# Patient Record
Sex: Female | Born: 1965 | Hispanic: Yes | Marital: Married
Health system: Midwestern US, Community
[De-identification: ages and names within clinical notes are randomized; demographics above are authoritative.]

## PROBLEM LIST (undated history)

## (undated) DIAGNOSIS — M5417 Radiculopathy, lumbosacral region: Secondary | ICD-10-CM

## (undated) HISTORY — PX: FRACTURE SURGERY: SHX138

---

## 2016-08-27 ENCOUNTER — Ambulatory Visit: Admit: 2016-08-27 | Payer: PRIVATE HEALTH INSURANCE

## 2016-08-27 ENCOUNTER — Inpatient Hospital Stay: Payer: PRIVATE HEALTH INSURANCE

## 2016-08-27 MED ORDER — IOPAMIDOL 41 % INTRATHECAL
200 mg iodine /mL (41 %) | INTRATHECAL | Status: DC | PRN
Start: 2016-08-27 — End: 2016-08-27
  Administered 2016-08-27: 12:00:00 via INTRA_ARTICULAR

## 2016-08-27 MED ORDER — SODIUM CHLORIDE 0.9 % INJECTION
INTRAMUSCULAR | Status: DC | PRN
Start: 2016-08-27 — End: 2016-08-27
  Administered 2016-08-27: 12:00:00

## 2016-08-27 MED ORDER — DEXAMETHASONE SODIUM PHOSPHATE (PF) 10 MG/ML INJECTION
10 mg/mL | INTRAMUSCULAR | Status: DC
Start: 2016-08-27 — End: 2016-08-27

## 2016-08-27 MED ORDER — PROPOFOL 10 MG/ML IV EMUL
10 mg/mL | INTRAVENOUS | Status: AC
Start: 2016-08-27 — End: 2016-08-27

## 2016-08-27 MED ORDER — IOPAMIDOL 41 % INTRATHECAL
200 mg iodine /mL (41 %) | INTRATHECAL | Status: DC
Start: 2016-08-27 — End: 2016-08-27

## 2016-08-27 MED ORDER — DEXAMETHASONE SODIUM PHOSPHATE (PF) 10 MG/ML INJECTION
10 mg/mL | INTRAMUSCULAR | Status: DC | PRN
Start: 2016-08-27 — End: 2016-08-27
  Administered 2016-08-27: 12:00:00 via EPIDURAL

## 2016-08-27 MED ORDER — LIDOCAINE (PF) 10 MG/ML (1 %) IJ SOLN
10 mg/mL (1 %) | INTRAMUSCULAR | Status: DC | PRN
Start: 2016-08-27 — End: 2016-08-27
  Administered 2016-08-27: 12:00:00 via EPIDURAL

## 2016-08-27 MED ORDER — SODIUM CHLORIDE 0.9 % INJECTION
INTRAMUSCULAR | Status: DC
Start: 2016-08-27 — End: 2016-08-27

## 2016-08-27 MED ORDER — LIDOCAINE (PF) 10 MG/ML (1 %) IJ SOLN
10 mg/mL (1 %) | INTRAMUSCULAR | Status: DC
Start: 2016-08-27 — End: 2016-08-27

## 2016-08-27 MED ORDER — PROPOFOL 10 MG/ML IV EMUL
10 mg/mL | INTRAVENOUS | Status: DC | PRN
Start: 2016-08-27 — End: 2016-08-27
  Administered 2016-08-27 (×4): via INTRAVENOUS

## 2016-08-27 MED ORDER — LACTATED RINGERS IV
INTRAVENOUS | Status: DC | PRN
Start: 2016-08-27 — End: 2016-08-27
  Administered 2016-08-27: 12:00:00 via INTRAVENOUS

## 2016-08-27 MED FILL — SODIUM CHLORIDE 0.9 % INJECTION: INTRAMUSCULAR | Qty: 10

## 2016-08-27 MED FILL — DIPRIVAN 10 MG/ML INTRAVENOUS EMULSION: 10 mg/mL | INTRAVENOUS | Qty: 20

## 2016-08-27 MED FILL — LACTATED RINGERS IV: INTRAVENOUS | Qty: 500

## 2016-08-27 MED FILL — ISOVUE-M 200  41 % INTRATHECAL SOLUTION: 200 mg iodine /mL (41 %) | INTRATHECAL | Qty: 10

## 2016-08-27 MED FILL — DEXAMETHASONE SODIUM PHOSPHATE (PF) 10 MG/ML INJECTION: 10 mg/mL | INTRAMUSCULAR | Qty: 1

## 2016-08-27 MED FILL — LIDOCAINE (PF) 10 MG/ML (1 %) IJ SOLN: 10 mg/mL (1 %) | INTRAMUSCULAR | Qty: 10

## 2016-08-27 NOTE — H&P (Signed)
H & P reviewed as hard copy and no changes were made.

## 2016-08-27 NOTE — Brief Op Note (Signed)
BRIEF OPERATIVE NOTE    Date of Procedure: 08/27/2016   Preoperative Diagnosis: M54.16  RADICULOPATHY  Postoperative Diagnosis: M54.16    Procedure(s):  LUMBAR/SACRAL TRANSFORAMINAL EPIDURAL STEROID INJECTION  Surgeon(s) and Role:     * Filomena Jungling, MD - Primary         Assistant Staff: None      Surgical Staff:  Circ-1: Lenord Carbo, RN  Radiology Technician: Caralee Ates, CNMT, RT, NM, ARRT  No case tracking events are documented in the log.  Anesthesia: MAC   Estimated Blood Loss: None  Specimens: * No specimens in log *   Findings: None  Complications: None  Implants: * No implants in log *

## 2016-08-27 NOTE — Other (Signed)
Discharged with armband removed.  Patient informed of privacy risk if armband lost or stolen. Pt pain 1/10 ice pack applied. Pt tolerating po. Pt instructed on discharge and follow up verbalized understanding.

## 2016-08-27 NOTE — Interval H&P Note (Signed)
H&P Update:  Amanda Woods was seen and examined.  No changes.     Signed By: Milinda AntisGabriel Mattei-Gonzalez, MD     August 27, 2016 7:48 AM

## 2016-08-27 NOTE — Anesthesia Post-Procedure Evaluation (Signed)
Post-Anesthesia Evaluation and Assessment    Patient: Amanda Woods MRN: [id number]  SSN: [id number]    Date of Birth: 1966/03/30  Age: 51 y.o.  Sex: female       Cardiovascular Function/Vital Signs  Visit Vitals   ??? BP 101/54   ??? Pulse 76   ??? Resp 18   ??? Ht 4' 11"  (1.499 m)   ??? Wt 81.6 kg (180 lb)   ??? SpO2 100%   ??? BMI 36.36 kg/m2       Patient is status post MAC anesthesia for Procedure(s):  LUMBAR/SACRAL TRANSFORAMINAL EPIDURAL STEROID INJECTION.    Nausea/Vomiting: None    Postoperative hydration reviewed and adequate.    Pain:  Pain Scale 1: Numeric (0 - 10) (08/27/16 0828)  Pain Intensity 1: 5 (08/27/16 6553)   Managed    Neurological Status:       At baseline    Mental Status and Level of Consciousness: Alert and oriented     Pulmonary Status:   O2 Device: Room air (08/27/16 0827)   Adequate oxygenation and airway patent    Complications related to anesthesia: None    Post-anesthesia assessment completed. No concerns    Signed By: Debroah Baller, CRNA     August 27, 2016

## 2016-08-27 NOTE — Op Note (Signed)
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 The risks, benefits, side effects, complications and alternatives to the procedure were discussed at length prior to its undertaking. Intravenous access was obtained. Conscious sedation was provided by the anesthesiologist. The patient was placed prone on the operating room table. Her back was prepped with Chlorhexadine using sterile technique. The fluoroscopic C-arm was rotated to bring the right  sacral ala pedicle in to view.  Then, 10 cc of 1% Xylocaine was administered for local anesthesia. A 22 gauge Quinke spinal needle was then passed along the trajectory of the fluoroscopic beam to the sacral ala.  The needle was then repositioned 1mm superiorly and anteriorly. The fluoroscopic-C arm rotated in to the anteroposterior plane to assess alignment and then into the lateral plane to assess depth. An epidurogram was obtained to confirm placement, checked both in the anteroposterior and lateral views, utilizing 3cc of Isovue M200 contrast agent. The syringe was changed over. The needle was aspirated. No blood, cerebral spinal fluid or air was observed. The injectate of 2ml cc of preservative-free normal saline, and 10 mg of Dexamethasone was then administered. Vital signs were monitored prior to and during the procedure. They remained stable. The patient was transferred by gurney to the post procedure area in a supine position. Vital signs were monitored for 30 minutes following the procedure and remained stable. The patient tolerated the procedure well without complication. She was discharged to  home. She was instructed to follow up with Dr. Reece AgarG. [facility] in two-three weeks.      Milinda AntisGabriel Mattei-Gonzalez, MD

## 2016-08-27 NOTE — Anesthesia Pre-Procedure Evaluation (Addendum)
Anesthetic History   No history of anesthetic complications            Review of Systems / Medical History  Patient summary reviewed, nursing notes reviewed and pertinent labs reviewed    Pulmonary                   Neuro/Psych     seizures (last episode 11 years ago, not on seizure meds)         Cardiovascular                       GI/Hepatic/Renal         Renal disease: stones       Endo/Other             Other Findings            Physical Exam    Airway  Mallampati: II  TM Distance: 4 - 6 cm  Neck ROM: normal range of motion   Mouth opening: Normal     Cardiovascular    Rhythm: regular  Rate: normal         Dental  No notable dental hx       Pulmonary                 Abdominal         Other Findings            Anesthetic Plan    ASA: 2  Anesthesia type: MAC          Induction: Intravenous  Anesthetic plan and risks discussed with: Patient

## 2016-08-27 NOTE — Op Note (Signed)
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 The risks, benefits, side effects, complications and alternatives to the procedure were discussed at length prior to its undertaking. Intravenous access was obtained. Conscious sedation was provided by the anesthesiologist. The patient was placed prone on the operating room table. Her back was prepped with Chlorhexadine using sterile technique. The fluoroscopic C-arm was rotated to bring the right  sacral ala pedicle in to view.  Then, 10 cc of 1% Xylocaine was administered for local anesthesia. A 22 gauge Quinke spinal needle was then passed along the trajectory of the fluoroscopic beam to the sacral ala.  The needle was then repositioned 1mm superiorly and anteriorly. The fluoroscopic-C arm rotated in to the anteroposterior plane to assess alignment and then into the lateral plane to assess depth. An epidurogram was obtained to confirm placement, checked both in the anteroposterior and lateral views, utilizing 3cc of Isovue M200 contrast agent. The syringe was changed over. The needle was aspirated. No blood, cerebral spinal fluid or air was observed. The injectate of 2ml cc of preservative-free normal saline, and 10 mg of Dexamethasone was then administered. Vital signs were monitored prior to and during the procedure. They remained stable. The patient was transferred by gurney to the post procedure area in a supine position. Vital signs were monitored for 30 minutes following the procedure and remained stable. The patient tolerated the procedure well without complication. She was discharged to home. She was instructed to follow  up with Dr. Reece [facility]G. [facility]onz[facility] in two-three weeks.      Milinda AntisGabriel Mattei-Gonzalez, MD

## 2017-03-10 ENCOUNTER — Ambulatory Visit: Admit: 2017-03-10 | Discharge: 2017-03-10 | Payer: PRIVATE HEALTH INSURANCE | Attending: Interventional Cardiology

## 2017-03-10 DIAGNOSIS — R9431 Abnormal electrocardiogram [ECG] [EKG]: Secondary | ICD-10-CM

## 2017-03-10 NOTE — Progress Notes (Addendum)
This is a 51 year old female with no significant past cardiac history who is now referred for evaluation prior to upcoming orthopedic surgery.    The patient denied prior history of cardiac disease.  She denies symptom of chest discomfort, shortness of breath at rest, dyspnea on exertion, PND or orthopnea.  She also denied palpitation, dizziness.  Routine medical evaluation with twelve-lead EKG showed poor R wave progression from V1 to  V3 suggestive of possible septal wall myocardial infarction.  The patient is scheduled to undergo right ankle surgery.    Past medical history: None.    Medication: None.    Social history: Negative for tobacco use.    Family history: Negative for coronary disease.    Review of Systems:    Constitutional: negative fever, negative chills, negative weight loss  Eyes:   negative diplopia or visual changes, negative eye pain  ENT:   negative coryza, negative sore throat  Respiratory:  negative cough, hemoptysis, dyspnea  Cards:  negative for chest pain, palpitations, lower extremity edema  GI:   negative for nausea, vomiting, diarrhea, and abdominal pain  Genitourinary: negative for frequency, dysuria and hematuria  Integument:  negative for rash and pruritus  Hematologic:  negative for easy bruising and gum/nose bleeding  Musculoskel: negative for myalgias, arthralgias, back pain and muscle weakness  Neurological:  negative for headaches, dizziness, vertigo, memory problems and gait problems  Behavl/Psych: negative for feelings of anxiety, depression     Physical examination: Well-developed well-nourished  moderate female in no acute distress.  Awake and alert, oriented ??3. Heart rate  76 and regular, blood pressure 120/90.  She weighs 198 pounds. HEENT: Extraocular movement intact, pupil equal and reactive to light and accommodation, hearing appeared to be normal, neck, supple no JVD or bruit.  Lungs: Clear to auscultation.  Heart: Normal S1, normal S2, no murmur audible.  Abdomen:  Soft, nontender, no organomegaly felt, no abdominal bruit noted.  Extremities: No peripheral edema, peripheral pulses are 2+ equal bilaterally, full range of motion without discomfort.  Neurological: Grossly intact    Twelve-lead ECG dated September 17 showed normal sinus rhythm, QS in V1 and V2.  Left ventricular hypertrophy by voltage.    Impression: 51 year old female with no significant past cardiac history and no symptoms suggestive of angina or congestive heart failure is now evaluated prior to the right ankle surgery.  Twelve-lead ECG is suggestive of possible prior septal  wall myocardial infarction.  Patient has no prior history suggestive of myocardial injury.  The EKG finding could be due to lead placement or body habitus.  I do not feel that this is a true representation of patient's underlying cardiac status.  Based on the patient is asymptomatic status and the nature of the procedure, there is no acute contraindication for surgery from cardiac point of view.    Recommendation: Proceed with right ankle surgery is planned.  Will obtain an echocardiography as part of baseline cardiac evaluation.  The surgery can be performed prior to the echocardiogram.    Discussed the patient's above normal BMI with the patient.  I have recommended the following interventions: dietary management education, guidance, and counseling  encourage exercise .      The patient was counseled on the dangers of tobacco use.     I attest that current meds have been reviewed and are accurate.

## 2017-05-12 ENCOUNTER — Encounter: Payer: PRIVATE HEALTH INSURANCE | Attending: Interventional Cardiology

## 2017-06-05 ENCOUNTER — Ambulatory Visit: Admit: 2017-06-05 | Discharge: 2017-06-05 | Payer: PRIVATE HEALTH INSURANCE | Attending: Interventional Cardiology

## 2017-06-05 DIAGNOSIS — R9431 Abnormal electrocardiogram [ECG] [EKG]: Secondary | ICD-10-CM

## 2017-06-05 NOTE — Progress Notes (Signed)
The patient is in office for follow-up visit for surgical follow-up and abnormal EKG.    Since the patient's last visit, she underwent a successful right ankle surgery.    In the office, the patient denied chest pain, shortness of breath, dyspnea on exertion, PND or orthopnea.  Echocardiogram showed normal left ventricular systolic function with mild to moderate aortic regurgitation.    Physical examination showed heart rate 100 and regular, blood pressure 130/84.  Patient weight 198 pounds which is unchanged from before.  Patient is awake and oriented ??3, in no acute distress..  Neck: Supple, no JVD or bruit.  Lungs: Clear to auscultation.  Heart: Normal S1, normal S2, no murmur.  Abdomen: Soft, nontender, no abdominal bruit noted.  Extremities: No peripheral edema, full range of motion, steady gait.  Neurological: Grossly intact.    Impression: 1 abnormal ECG, nonspecific changes.  2 mild aortic regurgitation.  The patient is asymptomatic.    Recommendation: Continue clinical observation.    Follow-up in 6 months.      Discussed the patient's above normal BMI with the patient.  I have recommended the following interventions: dietary management education, guidance, and counseling  encourage exercise .      The patient was counseled on the dangers of tobacco use.     I attest that current meds have been reviewed and are accurate.

## 2017-12-08 ENCOUNTER — Ambulatory Visit: Admit: 2017-12-08 | Discharge: 2017-12-08 | Attending: Interventional Cardiology

## 2017-12-08 ENCOUNTER — Ambulatory Visit: Attending: Interventional Cardiology

## 2017-12-08 DIAGNOSIS — I351 Nonrheumatic aortic (valve) insufficiency: Secondary | ICD-10-CM

## 2017-12-08 NOTE — Progress Notes (Signed)
The patient is in office for follow-up visit for history of abnormal EKG.    Since the patient's last visit, she has been feeling well.  She denied symptom of chest discomfort, shortness of breath at rest, dyspnea on exertion, PND or orthopnea.  Patient has remained physically active.    Physical examination showed heart rate 78 and regular, blood pressure 110/70 with repeat 118/70.  Patient weighs 195 pounds which is a decrease of 3 pounds from before..  Patient is awake and oriented ??3, in no acute distress..  Neck: Supple, no JVD or bruit.  Lungs: Clear to auscultation.  Heart: Normal S1, normal S2, no murmur.  Abdomen: Soft, nontender, no abdominal bruit noted.  Extremities: No peripheral edema, full range of motion, steady gait.  Neurological: Grossly intact.    Twelve-lead ECG showed normal sinus rhythm, minor nonspecific ST-T wave changes, left ventricular hypertrophy by voltage.    Impression: 1 history of abnormal EKG, nonspecific changes.  2 mild aortic regurgitation, asymptomatic.    Recommendation: Continue clinical observation.    Follow-up PRN.    Discussed the patient's above normal BMI with the patient.  I have recommended the following interventions: dietary management education, guidance, and counseling  encourage exercise .      The patient was counseled on the dangers of tobacco use.     I attest that current meds have been reviewed and are accurate.

## 2017-12-08 NOTE — Progress Notes (Signed)
The patient is in office for follow-up visit for history of abnormal EKG.    Since the patient's last visit, she has been feeling well.  She denied symptom of chest discomfort, shortness of breath at rest, dyspnea on exertion, PND or orthopnea.  Patient has remained physically active.    Physical examination showed heart rate 78 and regular, blood pressure 110/70 with repeat 118/70.  Patient weighs 195 pounds which is a decrease of 3 pounds from before..  Patient is awake and oriented ??3, in no acute distress..  Neck: Supple, no JVD or bruit.  Lungs: Clear to auscultation.  Heart: Normal S1, normal S2, no murmur.  Abdomen: Soft, nontender, no abdominal bruit noted.  Extremities: No peripheral edema, full range of motion, steady gait.  Neurological: Grossly intact.    Twelve-lead ECG showed normal sinus rhythm, minor nonspecific ST-T wave changes, left ventricular hypertrophy by voltage.    Impression: 1 history of abnormal EKG, nonspecific changes.  2 mild aortic regurgitation, asymptomatic.    Recommendation: Continue clinical observation.    Follow-up PRN.    Discussed the patient's above normal BMI with the patient.  I have recommended the following interventions: dietary management education, guidance, and counseling  encourage exercise .      The patient was counseled on the dangers of tobacco use.     I attest that current meds have been reviewed and are accurate.

## 2019-05-24 ENCOUNTER — Encounter: Attending: Family Medicine

## 2019-07-13 ENCOUNTER — Encounter: Attending: Internal Medicine

## 2019-07-28 ENCOUNTER — Ambulatory Visit: Admit: 2019-07-28 | Discharge: 2019-07-28 | Payer: MEDICAID | Attending: Internal Medicine

## 2019-07-28 ENCOUNTER — Ambulatory Visit: Attending: Internal Medicine

## 2019-07-28 NOTE — Progress Notes (Signed)
Chief Complaint:  54 y.o.female here to establish care       HPI   Pt previously was followed at [facility] she wants someone closer    Has had any significant medical issues  Has gained significant wt (180 lb in 2018->208)  C/o bilateral anterior shin pain with excessive walking       Past Medical History:   Diagnosis Date   ??? Seizures (HCC)     11 yrs. ago. not on any medications for seizures.   ??? Severe obesity (BMI 35.0-39.9) 03/10/2017       Past Surgical History:   Procedure Laterality Date   ??? HX CESAREAN SECTION     ??? HX UROLOGICAL      kidney stones 13 yrs. ago   ??? IR CHOLECYSTOSTOMY PERCUTANEOUS         Family History   Problem Relation Age of Onset   ??? No Known Problems Mother    ??? No Known Problems Father    ??? No Known Problems Sister    ??? No Known Problems Brother      Family Status   Relation Name Status   ??? Mother  (Not Specified)   ??? Father  (Not Specified)   ??? Sister 3 Alive   ??? Brother 6 Alive   ??? Child 3 Alive       No Known Allergies      Patient is tolerating all medications as prescribed with no barriers to adherence noted.    Health Maintenance Topics with due status: Overdue       Topic Date Due    DTaP/Tdap/Td series 02/08/1986    Lipid Screen 02/08/2005    Shingrix Vaccine Age 89> 02/09/2015    Colorectal Cancer Screening Combo 02/09/2015    Breast Cancer Screen Mammogram 03/06/2018    Flu Vaccine 02/16/2019           Social History     Socioeconomic History   ??? Marital status: MARRIED     Spouse name: manuel   ??? Number of children: 3   ??? Years of education: Not on file   ??? Highest education level: Not on file   Occupational History   ??? Not on file   Social Needs   ??? Financial resource strain: Somewhat hard   ??? Food insecurity     Worry: Never true     Inability: Never true   ??? Transportation needs     Medical: No     Non-medical: No   Tobacco Use   ??? Smoking status: Never Smoker   ??? Smokeless tobacco: Never Used   Substance and Sexual Activity   ??? Alcohol use: Yes      Frequency: Monthly or less     Comment: socially, few times a year.   ??? Drug use: No   ??? Sexual activity: Yes     Partners: Male   Lifestyle   ??? Physical activity     Days per week: Not on file     Minutes per session: Not on file   ??? Stress: Only a little   Relationships   ??? Social Wellsite geologist on phone: Not on file     Gets together: Not on file     Attends religious service: Not on file     Active member of club or organization: Not on file     Attends meetings of clubs or organizations: Not on file  Relationship status: Not on file   ??? Intimate partner violence     Fear of current or ex partner: Not on file     Emotionally abused: Not on file     Physically abused: Not on file     Forced sexual activity: Not on file   Other Topics Concern   ??? Not on file   Social History Narrative    ** Merged History Encounter **            REVIEW OF SYSTEMS:  Review of Systems:  Review of systems is negative for chest pain, chest pressure, nausea, vomiting, diarrhea, headache, fever, chills, hemoptysis, Orthopnea, Dyspnea, hematemesis, melena, bright red blood per rectum, dysuria, frequency, hematuria, any recent Gain or loss of weight, that is unexplained, or skin changes or rashes except as stated in HPI        General appearance: Well-developed, well-nourished, pleasant and comfortable  Visit Vitals  BP 120/80   Pulse 100   Temp 97.4 ??F (36.3 ??C) (Temporal)   Resp 16   Ht 4\' 11"  (1.499 m)   Wt 208 lb (94.3 kg)   SpO2 98%   BMI 42.01 kg/m??     HEENT: Normocepalic atraumatic Pupils equally round and reactive to light and accomodation. Pharynx tonsils nl. No exudate. Ears: tm nl.   Neck: Without lymphadenopathy, thyromegaly, jugular venous distention, or carotid bruit  Respiratory: lungs clear to auscultation bilaterally without rales, rhonchi, or wheeze  Cardiovascular: regular rate and rhythm without murmur, rub or gallup   Abdomen: soft, non-tender,nondistended without hepatosplenomegaly   Extremities:2+ dorsalis pedis bilaterally without clubbing,cyanosis, or edema  Skin: without rash, bruise, jaundice  Neurologic: alert and oriented to person, place, and date, cranial nerves grossly intact              EKG 07/28/2019 No results found for this visit on 07/28/19.      Plan  1. Class 3 severe obesity due to excess calories without serious comorbidity with body mass index (BMI) of 40.0 to 44.9 in adult [facility])  Discuss diet and exercisse  - CBC WITH AUTOMATED DIFF  - METABOLIC PANEL, COMPREHENSIVE  - LIPID PANEL  - TSH 3RD GENERATION  - VITAMIN D, 25 HYDROXY    2. Myalgia  Check labs   - CBC WITH AUTOMATED DIFF  - METABOLIC PANEL, COMPREHENSIVE  - LIPID PANEL  - TSH 3RD GENERATION  - VITAMIN D, 25 HYDROXY            Medications discussed, including all new medications prescribed at this visit.  Indications and side effects reviewed with the patient/family/caregiver with understanding verbalized  Follow-up and Dispositions     Routing History          Isaiah Serge, MD      I have spent 45 minutes reviewing previous notes, test results and face to face with the patient discussing the diagnosis and importance of compliance with the treatment plan as well as documenting on the day of the visit          Care Plan discussed with:    [] Patient   [] Family    [] Care Manager    [] Nursing   [] Consultant/Specialist :

## 2019-07-28 NOTE — Progress Notes (Signed)
scanned

## 2019-07-28 NOTE — Progress Notes (Signed)
Chief Complaint:  54 y.o.female here to establish care       HPI   Pt previously was followed at [facility] she wants someone closer    Has had any significant medical issues  Has gained significant wt (180 lb in 2018->208)  C/o bilateral anterior shin pain with excessive walking       Past Medical History:   Diagnosis Date   ??? Seizures (HCC)     11 yrs. ago. not on any medications for seizures.   ??? Severe obesity (BMI 35.0-39.9) 03/10/2017       Past Surgical History:   Procedure Laterality Date   ??? HX CESAREAN SECTION     ??? HX UROLOGICAL      kidney stones 13 yrs. ago   ??? IR CHOLECYSTOSTOMY PERCUTANEOUS         Family History   Problem Relation Age of Onset   ??? No Known Problems Mother    ??? No Known Problems Father    ??? No Known Problems Sister    ??? No Known Problems Brother      Family Status   Relation Name Status   ??? Mother  (Not Specified)   ??? Father  (Not Specified)   ??? Sister 3 Alive   ??? Brother 6 Alive   ??? Child 3 Alive       No Known Allergies      Patient is tolerating all medications as prescribed with no barriers to adherence noted.    Health Maintenance Topics with due status: Overdue       Topic Date Due    DTaP/Tdap/Td series 02/08/1986    Lipid Screen 02/08/2005    Shingrix Vaccine Age 86> 02/09/2015    Colorectal Cancer Screening Combo 02/09/2015    Breast Cancer Screen Mammogram 03/06/2018    Flu Vaccine 02/16/2019           Social History     Socioeconomic History   ??? Marital status: MARRIED     Spouse name: manuel   ??? Number of children: 3   ??? Years of education: Not on file   ??? Highest education level: Not on file   Occupational History   ??? Not on file   Social Needs   ??? Financial resource strain: Somewhat hard   ??? Food insecurity     Worry: Never true     Inability: Never true   ??? Transportation needs     Medical: No     Non-medical: No   Tobacco Use   ??? Smoking status: Never Smoker   ??? Smokeless tobacco: Never Used   Substance and Sexual Activity   ??? Alcohol use: Yes      Frequency: Monthly or less     Comment: socially, few times a year.   ??? Drug use: No   ??? Sexual activity: Yes     Partners: Male   Lifestyle   ??? Physical activity     Days per week: Not on file     Minutes per session: Not on file   ??? Stress: Only a little   Relationships   ??? Social Wellsite geologist on phone: Not on file     Gets together: Not on file     Attends religious service: Not on file     Active member of club or organization: Not on file     Attends meetings of clubs or organizations: Not on file  Relationship status: Not on file   ??? Intimate partner violence     Fear of current or ex partner: Not on file     Emotionally abused: Not on file     Physically abused: Not on file     Forced sexual activity: Not on file   Other Topics Concern   ??? Not on file   Social History Narrative    ** Merged History Encounter **            REVIEW OF SYSTEMS:  Review of Systems:  Review of systems is negative for chest pain, chest pressure, nausea, vomiting, diarrhea, headache, fever, chills, hemoptysis, Orthopnea, Dyspnea, hematemesis, melena, bright red blood per rectum, dysuria, frequency, hematuria, any recent Gain or loss of weight, that is unexplained, or skin changes or rashes except as stated in HPI        General appearance: Well-developed, well-nourished, pleasant and comfortable  Visit Vitals  BP 120/80   Pulse 100   Temp 97.4 ??F (36.3 ??C) (Temporal)   Resp 16   Ht 4\' 11"  (1.499 m)   Wt 208 lb (94.3 kg)   SpO2 98%   BMI 42.01 kg/m??     HEENT: Normocepalic atraumatic Pupils equally round and reactive to light and accomodation. Pharynx tonsils nl. No exudate. Ears: tm nl.   Neck: Without lymphadenopathy, thyromegaly, jugular venous distention, or carotid bruit  Respiratory: lungs clear to auscultation bilaterally without rales, rhonchi, or wheeze  Cardiovascular: regular rate and rhythm without murmur, rub or gallup   Abdomen: soft, non-tender,nondistended without hepatosplenomegaly  Extremities:2+ dorsalis  pedis bilaterally without clubbing,cyanosis, or edema  Skin: without rash, bruise, jaundice  Neurologic: alert and oriented to person, place, and date, cranial nerves grossly intact              EKG 07/28/2019 No results found for this visit on 07/28/19.      Plan  1. Class 3 severe obesity due to excess calories without serious comorbidity with body mass index (BMI) of 40.0 to 44.9 in adult [facility])  Discuss diet and exercisse  - CBC WITH AUTOMATED DIFF  - METABOLIC PANEL, COMPREHENSIVE  - LIPID PANEL  - TSH 3RD GENERATION  - VITAMIN D, 25 HYDROXY    2. Myalgia  Check labs   - CBC WITH AUTOMATED DIFF  - METABOLIC PANEL, COMPREHENSIVE  - LIPID PANEL  - TSH 3RD GENERATION  - VITAMIN D, 25 HYDROXY            Medications discussed, including all new medications prescribed at this visit.  Indications and side effects reviewed with the patient/family/caregiver with understanding verbalized  Follow-up and Dispositions     Routing History          Isaiah Serge, MD      I have spent 45 minutes reviewing previous notes, test results and face to face with the patient discussing the diagnosis and importance of compliance with the treatment plan as well as documenting on the day of the visit          Care Plan discussed with:    [] Patient   [] Family    [] Care Manager    [] Nursing   [] Consultant/Specialist :

## 2019-08-07 NOTE — Progress Notes (Signed)
Labs are fine   Inc vit d intake

## 2019-08-07 NOTE — Progress Notes (Signed)
Lm for pt to call us back

## 2019-08-07 NOTE — Progress Notes (Signed)
Lab are nl except vit d intake  Inc vit d intake

## 2019-08-07 NOTE — Progress Notes (Signed)
Labs are fine   Inc vit d intake

## 2019-08-07 NOTE — Progress Notes (Signed)
Lab are nl except vit d intake  Inc vit d intake

## 2019-08-08 LAB — CBC WITH AUTO DIFFERENTIAL
Basophils %: 0.7 % (ref 0–2)
Basophils Absolute: 42 cells/uL (ref 0–200)
Eosinophils %: 2.5 % (ref 0–8)
Eosinophils Absolute: 150 cells/uL (ref 15–500)
Hematocrit: 42.1 % (ref 35.0–45.0)
Hemoglobin: 13.8 g/dL (ref 11.7–15.5)
Lymphocytes %: 40.2 % (ref 15–49)
Lymphocytes Absolute: 2412 cells/uL (ref 850–3900)
MCH: 28.5 pg (ref 27.0–33.0)
MCHC: 32.8 g/dL (ref 32.0–36.0)
MCV: 86.8 fL (ref 80.0–100.0)
MPV: 10.5 fL (ref 7.5–12.5)
Monocytes %: 6.4 % (ref 0–13)
Monocytes Absolute: 384 cells/uL (ref 200–950)
Neutrophils %: 50.2 % (ref 38–80)
Neutrophils Absolute: 3012 cells/uL (ref 1500–7800)
Platelets: 291 10*3/uL (ref 140–400)
RBC: 4.85 10*6/uL (ref 3.80–5.10)
RDW: 13.1 % (ref 11.0–15.0)
WBC: 6 10*3/uL (ref 3.8–10.8)

## 2019-08-08 LAB — COMPREHENSIVE METABOLIC PANEL
ALT: 42 U/L — ABNORMAL HIGH (ref 6–29)
AST: 28 U/L (ref 10–35)
Albumin/Globulin Ratio: 1.7 (calc) (ref 1.0–2.5)
Albumin: 4.5 g/dL (ref 3.6–5.1)
Alkaline Phosphatase: 98 U/L (ref 37–153)
BUN: 11 mg/dL (ref 7–25)
CO2: 29 mmol/L (ref 20–32)
Calcium: 9.6 mg/dL (ref 8.6–10.4)
Chloride: 104 mmol/L (ref 98–110)
Creatinine: 0.54 mg/dL (ref 0.50–1.05)
EGFR IF NonAfrican American: 107 mL/min/{1.73_m2} (ref >=60)
GFR African American: 124 mL/min/{1.73_m2} (ref >=60)
Globulin: 2.6 g/dL (calc) (ref 1.9–3.7)
Glucose: 94 mg/dL (ref 65–99)
Potassium: 4.3 mmol/L (ref 3.5–5.3)
Sodium: 141 mmol/L (ref 135–146)
Total Bilirubin: 0.5 mg/dL (ref 0.2–1.2)
Total Protein: 7.1 g/dL (ref 6.1–8.1)

## 2019-08-08 LAB — LIPID PANEL
Chol/HDL Ratio: 3.1 (calc) (ref <5.0)
Cholesterol, Total: 195 mg/dL (ref <200)
Cholesterol, total: 195 mg/dL (ref <200)
Cholesterol/HDL ratio: 3.1 (calc) (ref <5.0)
HDL Cholesterol: 62 mg/dL (ref >=50)
HDL: 62 mg/dL (ref >=50)
LDL Cholesterol: 115 mg/dL (calc) — ABNORMAL HIGH
LDL-CHOLESTEROL: 115 mg/dL (calc) — ABNORMAL HIGH
Non-HDL Cholesterol: 133 mg/dL (calc) — ABNORMAL HIGH (ref <130)
Non-HDL Cholesterol: 133 mg/dL (calc) — ABNORMAL HIGH (ref <130)
Triglyceride: 85 mg/dL (ref <150)
Triglycerides: 85 mg/dL (ref <150)

## 2019-08-08 LAB — VITAMIN D 25 HYDROXY: Vit D, 25-Hydroxy: 24 ng/mL — ABNORMAL LOW (ref 30–100)

## 2019-08-08 LAB — TSH 3RD GENERATION
TSH: 2.39 mIU/L
TSH: 2.39 mIU/L

## 2019-08-08 LAB — METABOLIC PANEL, COMPREHENSIVE
ALB/GLOBRATIO: 1.7 (calc) (ref 1.0–2.5)
ALT (SGPT): 42 U/L — ABNORMAL HIGH (ref 6–29)
AST (SGOT): 28 U/L (ref 10–35)
Albumin: 4.5 g/dL (ref 3.6–5.1)
Alkaline Phosphatase, total: 98 U/L (ref 37–153)
BUN: 11 mg/dL (ref 7–25)
Bilirubin, total: 0.5 mg/dL (ref 0.2–1.2)
CO2: 29 mmol/L (ref 20–32)
Calcium: 9.6 mg/dL (ref 8.6–10.4)
Chloride: 104 mmol/L (ref 98–110)
Creatinine: 0.54 mg/dL (ref 0.50–1.05)
GFR est AA: 124 mL/min/{1.73_m2} (ref >=60)
GFR est non-AA: 107 mL/min/{1.73_m2} (ref >=60)
Globulin: 2.6 g/dL (calc) (ref 1.9–3.7)
Glucose: 94 mg/dL (ref 65–99)
Potassium: 4.3 mmol/L (ref 3.5–5.3)
Protein, total: 7.1 g/dL (ref 6.1–8.1)
Sodium: 141 mmol/L (ref 135–146)

## 2019-08-08 LAB — CBC WITH AUTOMATED DIFF
ABS. BASOPHILS: 42 cells/uL (ref 0–200)
ABS. EOSINOPHILS: 150 cells/uL (ref 15–500)
ABS. LYMPHOCYTES: 2412 cells/uL (ref 850–3900)
ABS. MONOCYTES: 384 cells/uL (ref 200–950)
ABS. NEUTROPHILS: 3012 cells/uL (ref 1500–7800)
BASOPHILS: 0.7 % (ref 0–2)
EOSINOPHILS: 2.5 % (ref 0–8)
HCT: 42.1 % (ref 35.0–45.0)
HGB: 13.8 g/dL (ref 11.7–15.5)
LYMPHOCYTES: 40.2 % (ref 15–49)
MCH: 28.5 pg (ref 27.0–33.0)
MCHC: 32.8 g/dL (ref 32.0–36.0)
MCV: 86.8 fL (ref 80.0–100.0)
MEAN PLATELET VOLUME: 10.5 fL (ref 7.5–12.5)
MONOCYTES: 6.4 % (ref 0–13)
Neutrophils: 50.2 % (ref 38–80)
PLATELET: 291 10*3/uL (ref 140–400)
RBC: 4.85 10*6/uL (ref 3.80–5.10)
RDW: 13.1 % (ref 11.0–15.0)
WBC: 6 10*3/uL (ref 3.8–10.8)

## 2019-08-08 LAB — VITAMIN D, 25 HYDROXY: VITAMIN D, 25-HYDROXY: 24 ng/mL — ABNORMAL LOW (ref 30–100)

## 2019-09-30 NOTE — Telephone Encounter (Signed)
Patient's daughter Nelva Bush [phone])  Advised of results.  How much OTC vitamin D?

## 2019-10-01 NOTE — Telephone Encounter (Signed)
Advised 

## 2020-08-02 ENCOUNTER — Encounter: Attending: Internal Medicine

## 2020-10-17 ENCOUNTER — Emergency Department (HOSPITAL_COMMUNITY): Payer: Self-pay

## 2020-10-17 ENCOUNTER — Emergency Department (HOSPITAL_COMMUNITY)
Admission: EM | Admit: 2020-10-17 | Discharge: 2020-10-18 | Disposition: A | Discharge status: Home / Self Care | Payer: Self-pay | Attending: Emergency Medicine | Admitting: Emergency Medicine

## 2020-10-17 DIAGNOSIS — K209 Esophagitis, unspecified without bleeding: Secondary | ICD-10-CM | POA: Insufficient documentation

## 2020-10-17 DIAGNOSIS — K2281 Esophageal polyp: Secondary | ICD-10-CM | POA: Insufficient documentation

## 2020-10-17 DIAGNOSIS — Z20822 Contact with and (suspected) exposure to covid-19: Secondary | ICD-10-CM | POA: Insufficient documentation

## 2020-10-17 DIAGNOSIS — R1314 Dysphagia, pharyngoesophageal phase: Secondary | ICD-10-CM

## 2020-10-17 DIAGNOSIS — K259 Gastric ulcer, unspecified as acute or chronic, without hemorrhage or perforation: Secondary | ICD-10-CM | POA: Insufficient documentation

## 2020-10-17 DIAGNOSIS — R1310 Dysphagia, unspecified: Secondary | ICD-10-CM | POA: Insufficient documentation

## 2020-10-17 DIAGNOSIS — K317 Polyp of stomach and duodenum: Secondary | ICD-10-CM | POA: Insufficient documentation

## 2020-10-17 DIAGNOSIS — F458 Other somatoform disorders: Secondary | ICD-10-CM | POA: Insufficient documentation

## 2020-10-17 DIAGNOSIS — B9681 Helicobacter pylori [H. pylori] as the cause of diseases classified elsewhere: Secondary | ICD-10-CM | POA: Insufficient documentation

## 2020-10-17 DIAGNOSIS — K2950 Unspecified chronic gastritis without bleeding: Secondary | ICD-10-CM | POA: Insufficient documentation

## 2020-10-17 MED ORDER — ALUM & MAG HYDROXIDE-SIMETH 200-200-20 MG/5ML PO SUSP
30.0000 mL | Freq: Once | ORAL | Status: AC
  Administered 2020-10-17: 30 mL via ORAL
  Filled 2020-10-17: qty 30

## 2020-10-17 MED ORDER — LIDOCAINE VISCOUS HCL 2 % MT SOLN
15.0000 mL | Freq: Once | OROMUCOSAL | Status: AC
  Administered 2020-10-17: 15 mL via ORAL
  Filled 2020-10-17: qty 15

## 2020-10-17 NOTE — ED Triage Notes (Signed)
Pt states she feels like she has a fish bone stuck in her throat after eating dinner around 730p. Denies any vomiting or choking since and has tried to eat without difficulty. C/o L sided throat pain

## 2020-10-17 NOTE — ED Provider Notes (Signed)
[facility]-EMERGENCY DEPT Provider Note   CSN: [id number] Arrival date & time: 10/17/20  2108     History Chief Complaint  Patient presents with  . Dysphagia    Angelica Turner is a 55 y.o. female.  Patient to ED with painful swallowing after eating fish, feeling like there is a fish bone stuck in her throat. She has passed solids and liquids since onset without difficulty. No choking or trouble breathing. No vomiting.   The history is provided by the patient and the spouse. No language interpreter was used.       No past medical history on file.  There are no problems to display for this patient.  OB History   No obstetric history on file.     No family history on file.     Home Medications Prior to Admission medications   Not on File    Allergies    Patient has no known allergies.  Review of Systems   Review of Systems  Constitutional: Negative for fever.  HENT:       See HPI.  Respiratory: Negative for choking and shortness of breath.   Cardiovascular: Negative for chest pain.  Gastrointestinal: Negative for vomiting.    Physical Exam Updated Vital Signs BP (!) 146/79   Pulse 90   Temp 98.7 F (37.1 C) (Oral)   Resp 18   Ht 4\' 11"  (1.499 m)   Wt 90.7 kg   SpO2 97%   BMI 40.40 kg/m   Physical Exam Vitals and nursing note reviewed.  Constitutional:      General: She is not in acute distress.    Appearance: Normal appearance. She is obese. She is not ill-appearing.  HENT:     Head:      Mouth/Throat:     Mouth: Mucous membranes are moist.     Pharynx: Oropharynx is clear. No posterior oropharyngeal erythema.  Neck:     Thyroid: No thyromegaly.  Pulmonary:     Effort: Pulmonary effort is normal.  Musculoskeletal:        General: Normal range of motion.     Cervical back: Normal range of motion and neck supple.  Skin:    General: Skin is warm and dry.  Neurological:     Mental Status: She is alert and oriented  to person, place, and time.     ED Results / Procedures / Treatments   Labs (all labs ordered are listed, but only abnormal results are displayed) Labs Reviewed - No data to display  EKG None  Radiology No results found.  Procedures Procedures   Medications Ordered in ED Medications - No data to display  ED Course  I have reviewed the triage vital signs and the nursing notes.  Pertinent labs & imaging results that were available during my care of the patient were reviewed by me and considered in my medical decision making (see chart for details).    MDM Rules/Calculators/A&P                          Patient to ED with the sense of fish bone stuck in her throat.  Imaging is negative for FB. Will give GI cocktail with lidocaine for pain relief.   Discussed with Dr. , GI, who advises she can take her to endoscopy in the morning. Patient and husband are agreeable to staying in the ED until that time. COVID collected/pending.   COVID negative. Patient  pending endoscopic procedure with GI. Disposition per GI consult.   Final Clinical Impression(s) / ED Diagnoses Final diagnoses:  None   1. Foreign body in throat  Rx / DC Orders ED Discharge Orders    None       Elpidio Anis, PA-C 10/18/20 0703    Maia Plan, MD 10/20/20 2340

## 2020-10-17 NOTE — ED Provider Notes (Signed)
Emergency Medicine Provider Triage Evaluation Note  Angelica Turner 55 y.o. female was evaluated in triage.  Pt complains of feeling like something is stuck in her throat. She was eating dinner and states she feels like she has a fish bone stuck in there. She was able to drink and eat a banana after and it went down but felt irritated. No SOB, vomiting.    Review of Systems  Positive: Globus sensation  Negative: SOB, vomiting   Physical Exam  BP 134/82   Pulse 70   Temp 98.2 F (36.8 C) (Oral)   Resp 18   Ht 5\' 4"  (1.626 m)   Wt 65.8 kg   SpO2 100%   BMI 24.89 kg/m  Gen:   Awake, no distress   HEENT:  Atraumatic. Posterior oropharynx clear. No neck edema.  Resp:  Normal effort. No respiratory distress.  Cardiac:  Normal rate  Abd:   Nondistended, nontender  MSK:   Moves extremities without difficulty  Neuro:  Speech clear   Medical Decision Making  Medically screening exam initiated at 3:55 AM.  Appropriate orders placed.  Angelica Turner was informed that the remainder of the evaluation will be completed by another provider, this initial triage assessment does not replace that evaluation, and the importance of remaining in the ED until their evaluation is complete.    Clinical Impression  Globus sensation.    Portions of this note were generated with Casimer Leek. Dictation errors may occur despite best attempts at proofreading.     Scientist, clinical (histocompatibility and immunogenetics), PA-C 10/17/20 2123    2124, MD 10/20/20 2340

## 2020-10-18 ENCOUNTER — Encounter (HOSPITAL_COMMUNITY)
Admission: EM | Disposition: A | Discharge status: Home / Self Care | Payer: Self-pay | Source: Home / Self Care | Attending: Emergency Medicine

## 2020-10-18 ENCOUNTER — Encounter (HOSPITAL_COMMUNITY): Payer: Self-pay | Admitting: Anesthesiology

## 2020-10-18 ENCOUNTER — Other Ambulatory Visit: Payer: Self-pay

## 2020-10-18 ENCOUNTER — Emergency Department (HOSPITAL_COMMUNITY): Payer: Self-pay | Admitting: Anesthesiology

## 2020-10-18 HISTORY — PX: ESOPHAGOGASTRODUODENOSCOPY (EGD) WITH PROPOFOL: SHX5813

## 2020-10-18 HISTORY — PX: BIOPSY: SHX5522

## 2020-10-18 HISTORY — PX: POLYPECTOMY: SHX5525

## 2020-10-18 LAB — RESP PANEL BY RT-PCR (FLU A&B, COVID) ARPGX2
Influenza A by PCR: NEGATIVE
Influenza B by PCR: NEGATIVE
SARS Coronavirus 2 by RT PCR: NEGATIVE

## 2020-10-18 SURGERY — ESOPHAGOGASTRODUODENOSCOPY (EGD) WITH PROPOFOL
Anesthesia: Monitor Anesthesia Care

## 2020-10-18 MED ORDER — PROPOFOL 10 MG/ML IV BOLUS
INTRAVENOUS | Status: DC | PRN
  Administered 2020-10-18: 125 ug/kg/min via INTRAVENOUS

## 2020-10-18 MED ORDER — ONDANSETRON HCL 4 MG/2ML IJ SOLN
INTRAMUSCULAR | Status: DC | PRN
  Administered 2020-10-18: 4 mg via INTRAVENOUS

## 2020-10-18 MED ORDER — PROPOFOL 10 MG/ML IV BOLUS
INTRAVENOUS | Status: AC
  Filled 2020-10-18: qty 20

## 2020-10-18 MED ORDER — SODIUM CHLORIDE 0.9 % IV SOLN: INTRAVENOUS | Status: DC

## 2020-10-18 MED ORDER — MIDAZOLAM HCL 5 MG/5ML IJ SOLN
INTRAMUSCULAR | Status: DC | PRN
  Administered 2020-10-18: 2 mg via INTRAVENOUS

## 2020-10-18 MED ORDER — MIDAZOLAM HCL 2 MG/2ML IJ SOLN
INTRAMUSCULAR | Status: AC
  Filled 2020-10-18: qty 2

## 2020-10-18 MED ORDER — LIDOCAINE 2% (20 MG/ML) 5 ML SYRINGE
INTRAMUSCULAR | Status: DC | PRN
  Administered 2020-10-18: 50 mg via INTRAVENOUS

## 2020-10-18 MED ORDER — PANTOPRAZOLE SODIUM 40 MG PO TBEC: 40.0000 mg | DELAYED_RELEASE_TABLET | Freq: Every day | ORAL | 1 refills | Status: AC

## 2020-10-18 MED ORDER — LACTATED RINGERS IV SOLN
INTRAVENOUS | Status: DC
  Administered 2020-10-18: 1000 mL via INTRAVENOUS

## 2020-10-18 SURGICAL SUPPLY — 14 items

## 2020-10-18 NOTE — Anesthesia Preprocedure Evaluation (Addendum)
Anesthesia Evaluation  Patient identified by MRN, date of birth, ID band Patient awake    Reviewed: Allergy & Precautions, NPO status , Patient's Chart, lab work & pertinent test results  Airway Mallampati: I  TM Distance: >3 FB Neck ROM: Full    Dental  (+) Partial Upper   Pulmonary neg pulmonary ROS,    Pulmonary exam normal breath sounds clear to auscultation       Cardiovascular negative cardio ROS Normal cardiovascular exam Rhythm:Regular Rate:Normal     Neuro/Psych negative neurological ROS  negative psych ROS   GI/Hepatic negative GI ROS, Neg liver ROS,   Endo/Other  Morbid obesity  Renal/GU negative Renal ROS     Musculoskeletal negative musculoskeletal ROS (+)   Abdominal (+) + obese,   Peds  Hematology negative hematology ROS (+)   Anesthesia Other Findings food bolus  Reproductive/Obstetrics                            Anesthesia Physical Anesthesia Plan  ASA: III  Anesthesia Plan: MAC   Post-op Pain Management:    Induction: Intravenous  PONV Risk Score and Plan: 2 and Propofol infusion and Treatment may vary due to age or medical condition  Airway Management Planned: Nasal Cannula  Additional Equipment:   Intra-op Plan:   Post-operative Plan:   Informed Consent: I have reviewed the patients History and Physical, chart, labs and discussed the procedure including the risks, benefits and alternatives for the proposed anesthesia with the patient or authorized representative who has indicated his/her understanding and acceptance.     Dental advisory given  Plan Discussed with: CRNA  Anesthesia Plan Comments:        Anesthesia Quick Evaluation

## 2020-10-18 NOTE — Transfer of Care (Signed)
Immediate Anesthesia Transfer of Care Note  Patient: Angelica Turner  Procedure(s) Performed: Procedure(s): ESOPHAGOGASTRODUODENOSCOPY (EGD) WITH PROPOFOL (N/A) BIOPSY POLYPECTOMY  Patient Location: PACU  Anesthesia Type:MAC  Level of Consciousness:  sedated, patient cooperative and responds to stimulation  Airway & Oxygen Therapy:Patient Spontanous Breathing and Patient connected to face mask oxgen  Post-op Assessment:  Report given to PACU RN and Post -op Vital signs reviewed and stable  Post vital signs:  Reviewed and stable  Last Vitals:  Vitals:   10/18/20 0800 10/18/20 0825  BP: 132/76 (!) 159/85  Pulse: 80 87  Resp: 14 (!) 21  Temp:  (!) 36.2 C  SpO2: 73% 22%    Complications: No apparent anesthesia complications

## 2020-10-18 NOTE — ED Notes (Addendum)
Awaiting daughters arrival for ride home. Pt A&Ox4. Ambulatory.

## 2020-10-18 NOTE — ED Provider Notes (Signed)
07:00: Assumed care of patient from Elpidio Anis PA-C at change of shift pending GI taking patient to endoscopy.   Please see prior provider note for full H&P.  Briefly patient is a 55 yo female who presented with concern for fishbone stuck in her throat.   Prior provider discussed w/ GI.   Upper endoscopy:   POST-OPERATIVE DIAGNOSIS:  Gastritis, Gastric ulcer, esophagitis, gastric body polyp, esophageal polyp, no food bolus in esophagus  PROCEDURE:  Procedure(s): ESOPHAGOGASTRODUODENOSCOPY (EGD) WITH PROPOFOL (N/A) BIOPSY POLYPECTOMY  Patient resting comfortably. Airway is patent. Appears appropriate for discharge at this time. Interpretor utilized to re-discuss endoscopy results & plan. PPI prescribed & discharge instructions prepared by GI.   I discussed results, treatment plan, need for follow-up, and return precautions with the patient. Provided opportunity for questions, patient confirmed understanding and is in agreement with plan.      Cherly Anderson, PA-C 10/18/20 1051    Milagros Loll, MD 10/19/20 [phone]

## 2020-10-18 NOTE — H&P (Signed)
Angelica Turner is an 55 y.o. female.   Chief Complaint: Fishbone stuck in lower throat HPI: 55 year old Spanish-speaking female, history obtained with [employer] video interpretation#[id number]. Patient states that yesterday evening at 8 PM when she was eating fish she immediately felt a bone stuck in her lower throat, she tried eating bananas, drinking water, eating tortillas to help push the fishbone, however it continued to feel stuck in the same place which prompted her to come to the ER. Patient has been able to tolerate fluids and solids without associated nausea, vomiting, drooling or respiratory distress.   History reviewed. No pertinent past medical history.  History reviewed. No pertinent surgical history.  History reviewed. No pertinent family history. Social History:  has no history on file for tobacco use, alcohol use, and drug use.  Allergies: No Known Allergies  No medications prior to admission.    Results for orders placed or performed during the hospital encounter of 10/17/20 (from the past 48 hour(s))  Resp Panel by RT-PCR (Flu A&B, Covid) Nasopharyngeal Swab     Status: None   Collection Time: 10/18/20 12:14 AM   Specimen: Nasopharyngeal Swab; Nasopharyngeal(NP) swabs in vial transport medium  Result Value Ref Range   SARS Coronavirus 2 by RT PCR NEGATIVE NEGATIVE    Comment: (NOTE) SARS-CoV-2 target nucleic acids are NOT DETECTED.  The SARS-CoV-2 RNA is generally detectable in upper respiratory specimens during the acute phase of infection. The lowest concentration of SARS-CoV-2 viral copies this assay can detect is 138 copies/mL. A negative result does not preclude SARS-Cov-2 infection and should not be used as the sole basis for treatment or other patient management decisions. A negative result may occur with  improper specimen collection/handling, submission of specimen other than nasopharyngeal swab, presence of viral mutation(s) within the areas targeted by  this assay, and inadequate number of viral copies(<138 copies/mL). A negative result must be combined with clinical observations, patient history, and epidemiological information. The expected result is Negative.  Fact Sheet for Patients:  BloggerCourse.com  Fact Sheet for Healthcare Providers:  SeriousBroker.it  This test is no t yet approved or cleared by the [place] FDA and  has been authorized for detection and/or diagnosis of SARS-CoV-2 by FDA under an Emergency Use Authorization (EUA). This EUA will remain  in effect (meaning this test can be used) for the duration of the COVID-19 declaration under Section 564(b)(1) of the Act, 21 U.S.C.section 360bbb-3(b)(1), unless the authorization is terminated  or revoked sooner.       Influenza A by PCR NEGATIVE NEGATIVE   Influenza B by PCR NEGATIVE NEGATIVE    Comment: (NOTE) The Xpert Xpress SARS-CoV-2/FLU/RSV plus assay is intended as an aid in the diagnosis of influenza from Nasopharyngeal swab specimens and should not be used as a sole basis for treatment. Nasal washings and aspirates are unacceptable for Xpert Xpress SARS-CoV-2/FLU/RSV testing.  Fact Sheet for Patients: BloggerCourse.com  Fact Sheet for Healthcare Providers: SeriousBroker.it  This test is not yet approved or cleared by the [place] FDA and has been authorized for detection and/or diagnosis of SARS-CoV-2 by FDA under an Emergency Use Authorization (EUA). This EUA will remain in effect (meaning this test can be used) for the duration of the COVID-19 declaration under Section 564(b)(1) of the Act, 21 U.S.C. section 360bbb-3(b)(1), unless the authorization is terminated or revoked.  Performed at [facility], [address] [place], [place] [address]    DG Neck Soft Tissue  Result Date: 10/17/2020 CLINICAL DATA:  Foreign body  sensation EXAM: NECK SOFT TISSUES - 1+ VIEW COMPARISON:  None. FINDINGS: There is no evidence of retropharyngeal soft tissue swelling or epiglottic enlargement. The cervical airway is unremarkable and no radio-opaque foreign body identified. IMPRESSION: Negative. Electronically Signed   By: Jasmine Pang M.D.   On: 10/17/2020 23:00    Review of Systems  Constitutional: Negative.   HENT: Negative.   Eyes: Negative.   Respiratory: Negative.   Cardiovascular: Negative.   Gastrointestinal: Negative.   Endocrine: Negative.   Genitourinary: Negative.   Musculoskeletal: Negative.   Neurological: Negative.   Psychiatric/Behavioral: Negative.     Blood pressure 132/76, pulse 80, temperature 98.7 F (37.1 C), temperature source Oral, resp. rate 14, height 4\' 11"  (1.499 m), weight 90.7 kg, SpO2 97 %. Physical Exam Constitutional:      Appearance: Normal appearance.  HENT:     Head: Normocephalic and atraumatic.     Mouth/Throat:     Mouth: Mucous membranes are moist.  Eyes:     Conjunctiva/sclera: Conjunctivae normal.  Cardiovascular:     Rate and Rhythm: Normal rate and regular rhythm.     Pulses: Normal pulses.     Heart sounds: Normal heart sounds.  Pulmonary:     Effort: Pulmonary effort is normal.  Abdominal:     General: Bowel sounds are normal.  Skin:    General: Skin is warm and dry.  Neurological:     General: No focal deficit present.     Mental Status: She is alert and oriented to person, place, and time.  Psychiatric:        Mood and Affect: Mood normal.        Behavior: Behavior normal.      Assessment/Plan Sensation of fishbone stuck in lower throat. Plan EGD for possible removal. Discussed with patient that if the bone is stuck above the esophageal area, we may need ENT involvement. The risks and the benefits of the procedure were discussed with the patient in details. She understands and verbalizes consent.  , MD 10/18/2020, 8:18 AM

## 2020-10-18 NOTE — Brief Op Note (Signed)
10/17/2020 - 10/18/2020  9:07 AM  PATIENT:  Angelica Turner  55 y.o. female  PRE-OPERATIVE DIAGNOSIS:  food bolus  POST-OPERATIVE DIAGNOSIS:  Gastritis, Gastric ulcer, esophagitis, gastric body polyp, esophageal polyp, no food bolus in esophagus  PROCEDURE:  Procedure(s): ESOPHAGOGASTRODUODENOSCOPY (EGD) WITH PROPOFOL (N/A) BIOPSY POLYPECTOMY  SURGEON:  Surgeon(s) and Role:    Ronnette Juniper, MD - Primary  PHYSICIAN ASSISTANT:   ASSISTANTS: Shelby Mattocks   ANESTHESIA:   MAC  EBL:  Minimal  BLOOD ADMINISTERED:none  DRAINS: none   LOCAL MEDICATIONS USED:  NONE  SPECIMEN:  Biopsy / Limited Resection  DISPOSITION OF SPECIMEN:  PATHOLOGY  COUNTS:  YES  TOURNIQUET:  * No tourniquets in log *  DICTATION: .Dragon Dictation  PLAN OF CARE: Discharge to home after PACU  PATIENT DISPOSITION:  PACU - hemodynamically stable.   Delay start of Pharmacological VTE agent (>24hrs) due to surgical blood loss or risk of bleeding: not applicable

## 2020-10-18 NOTE — ED Notes (Signed)
Received report from PACU RN. As per PACU pt is ready for d/c notified MD Dykstra.

## 2020-10-18 NOTE — ED Notes (Signed)
Endoscopy RN at bedside, took pt to endoscopy.

## 2020-10-18 NOTE — Discharge Instructions (Addendum)
USTED TUVO UN PROCEDIMIENTO ENDOSCPICO HOY EN EL [facility]:   Lea el informe del procedimiento que se le entreg para cualquier pregunta especfica sobre lo que se Dentist.  Si el informe del examen no responde a sus preguntas, por favor llame a su gastroenterlogo para aclararlo.  Si usted solicit que no se le den [employer] de lo que se Clinical cytogeneticist en su procedimiento al [employer] va a cuidar, entonces el informe del procedimiento se ha incluido en un sobre sellado para que usted lo revise despus cuando le sea ms conveniente.   LO QUE PUEDE ESPERAR: Algunas sensaciones de hinchazn en el abdomen.  Puede tener ms gases de lo normal.  El caminar puede ayudarle a eliminar el aire que se le puso en el tracto gastrointestinal durante el procedimiento y reducir la hinchazn.  Si le hicieron una endoscopia inferior (como una colonoscopia o una sigmoidoscopia flexible), podra notar manchas de sangre en las heces fecales o en el papel higinico.  Si se someti a una preparacin intestinal para su procedimiento, es posible que no tenga una evacuacin intestinal normal durante [employer].   Tenga en cuenta:  Es posible que note un poco de irritacin y congestin en la nariz o algn drenaje.  Esto es debido al oxgeno [employer] durante su procedimiento.  No hay que preocuparse y esto debe desaparecer ms o Regulatory affairs officer.   SNTOMAS PARA REPORTAR INMEDIATAMENTE:   Despus de la endoscopia superior (EGD)  Vmitos de Retail buyer o material como caf molido   Dolor en el pecho o dolor debajo de los omplatos que antes no tena   Dolor o dificultad persistente para tragar  Falta de aire que antes no tena   Fiebre de 100F o ms  Heces fecales negras y pegajosas   Para asuntos urgentes o de Associate Professor, puede comunicarse con un gastroenterlogo a cualquier hora llamando al [phone].  DIETA:  Recomendamos una comida pequea al principio, pero luego puede continuar  con su dieta normal.  Tome muchos lquidos, Tax adviser las bebidas alcohlicas durante 24 horas.    ACTIVIDAD:  Debe planear tomarse las cosas con calma por el resto del da y no debe CONDUCIR ni usar maquinaria pesada Patent examiner (debido a los medicamentos de sedacin utilizados durante el examen).     SEGUIMIENTO: Nuestro personal llamar al nmero que aparece en su historial al siguiente da hbil de su procedimiento para ver cmo se siente y para responder cualquier pregunta o inquietud que pueda tener con respecto a la informacin que se le dio despus del procedimiento. Si no podemos contactarle, le dejaremos un mensaje.  Sin embargo, si se siente bien y no tiene English as a second language teacher, no es necesario que nos devuelva la llamada.  Asumiremos que ha regresado a sus actividades diarias normales sin incidentes. Si se le tomaron algunas biopsias, le contactaremos por telfono o por carta en las prximas 3 semanas.  Si no ha sabido [employer] biopsias en el transcurso de 3 semanas, por favor llmenos al [phone].   FIRMAS/CONFIDENCIALIDAD: Usted y/o el acompaante que le cuide han firmado documentos que se ingresarn en su historial mdico Forensic scientist.  Estas firmas atestiguan el hecho de que la informacin anterior   YOU HAD AN ENDOSCOPIC PROCEDURE TODAY: Refer to the procedure report and other information in the discharge instructions given to you for any specific questions about what was found during the examination. If this information does not answer  your questions, please call [facility] office at [phone] to clarify.   YOU SHOULD EXPECT: Some feelings of bloating in the abdomen. Passage of more gas than usual. Walking can help get rid of the air that was put into your GI tract during the procedure and reduce the bloating.  DIET: Your first meal following the procedure should be a light meal and then it is ok to progress to your normal diet. A half-sandwich or bowl of soup is an  example of a good first meal. Heavy or fried foods are harder to digest and may make you feel nauseous or bloated. Drink plenty of fluids but you should avoid alcoholic beverages for 24 hours.  ACTIVITY: Your care partner should take you home directly after the procedure. You should plan to take it easy, moving slowly for the rest of the day. You can resume normal activity the day after the procedure however  YOU SHOULD NOT DRIVE, use power tools, machinery or perform tasks that involve climbing or major physical exertion for 24 hours (because of the sedation medicines used during the test).   SYMPTOMS TO REPORT IMMEDIATELY: A gastroenterologist can be reached at any hour. Please call [phone]  for any of the following symptoms:   . Following upper endoscopy (EGD, EUS, ERCP, esophageal dilation) Vomiting of blood or coffee ground material  New, significant abdominal pain  New, significant chest pain or pain under the shoulder blades  Painful or persistently difficult swallowing  New shortness of breath  Black, tarry-looking or red, bloody stools  FOLLOW UP:  If any biopsies were taken you will be contacted by phone or by letter within the next 1-3 weeks. Call [phone]  if you have not heard about the biopsies in 3 weeks.  Please also call with any specific questions about appointments or follow up tests.

## 2020-10-18 NOTE — Op Note (Signed)
[facility] Patient Name: Angelica Turner Procedure Date: 10/18/2020 MRN: [id number] Attending MD: Kerin Salen , MD Date of Birth: Nov 07, 1965 CSN: [id number] Age: 55 Admit Type: Emergency Department Procedure:                Upper GI endoscopy Indications:              Sensation of fish bone stuck in lower throat Providers:                Kerin Salen, MD, Estella Husk RN, RN, Michele Mcalpine                            Technician Referring MD:             ER Medicines:                Monitored Anesthesia Care Complications:            No immediate complications. Estimated blood loss:                            Minimal. Estimated Blood Loss:     Estimated blood loss was minimal. Procedure:                Pre-Anesthesia Assessment:                           - Prior to the procedure, a History and Physical                            was performed, and patient medications and                            allergies were reviewed. The patient's tolerance of                            previous anesthesia was also reviewed. The risks                            and benefits of the procedure and the sedation                            options and risks were discussed with the patient.                            All questions were answered, and informed consent                            was obtained. Prior Anticoagulants: The patient has                            taken no previous anticoagulant or antiplatelet                            agents. ASA Grade Assessment: II - A patient with  mild systemic disease. After reviewing the risks                            and benefits, the patient was deemed in                            satisfactory condition to undergo the procedure.                           After obtaining informed consent, the endoscope was                            passed under direct vision. Throughout the                            procedure,  the patient's blood pressure, pulse, and                            oxygen saturations were monitored continuously. The                            GIF-H190 ([id number] Olympus gastroscope was                            introduced through the mouth, and advanced to the                            second part of duodenum. The upper GI endoscopy was                            accomplished without difficulty. The patient                            tolerated the procedure well. Scope In: Scope Out: Findings:      The upper third of the esophagus and middle third of the esophagus were       normal.      A single 5 mm polyp with no bleeding was found 38 cm from the incisors.       The polyp was removed with a cold biopsy forceps. Resection and       retrieval were complete.      The Z-line was regular and was found 35 cm from the incisors.      Diffuse moderately erythematous mucosa with bleeding on contact was       found in the entire examined stomach.      The cardia and gastric fundus were otherwise normal on retroflexion,       except for mucosal friability.      One non-bleeding superficial gastric ulcer with a clean ulcer base       (Forrest Class III) was found in the gastric antrum. The lesion was 5 mm       in largest dimension. Biopsies were taken with a cold forceps for       Helicobacter pylori testing.      The examined duodenum was normal.      A single 5 mm sessile polyp  with no bleeding and no stigmata of recent       bleeding was found in the gastric body. The polyp was removed with a       piecemeal technique using a cold biopsy forceps. Resection and retrieval       were complete. Impression:               - Normal upper third of esophagus and middle third                            of esophagus.                           - Esophageal polyp(s) were found. Resected and                            retrieved.                           - Z-line regular, 35 cm from the  incisors.                           - Erythematous mucosa in the stomach.                           - Non-bleeding gastric ulcer with a clean ulcer                            base (Forrest Class III). Biopsied.                           - Normal examined duodenum.                           - A single gastric polyp. Resected and retrieved. Moderate Sedation:      None Recommendation:           - Patient has a contact number available for                            emergencies. The signs and symptoms of potential                            delayed complications were discussed with the                            patient. Return to normal activities tomorrow.                            Written discharge instructions were provided to the                            patient.                           - Advance diet as tolerated.                           -  Continue present medications.                           - Await pathology results.                           - If patient has persistent symptoms of sensation                            of "Fish bone stuck" in her throat, recommend                            Outpatient ENT evaluation. Procedure Code(s):        --- Professional ---                           [id number], Esophagogastroduodenoscopy, flexible,                            transoral; with biopsy, single or multiple Diagnosis Code(s):        --- Professional ---                           K22.8, Other specified diseases of esophagus                           K31.89, Other diseases of stomach and duodenum                           K25.9, Gastric ulcer, unspecified as acute or                            chronic, without hemorrhage or perforation                           K31.7, Polyp of stomach and duodenum CPT copyright 2019 American Medical Association. All rights reserved. The codes documented in this report are preliminary and upon coder review may  be revised to meet current compliance  requirements. Kerin Salen, MD 10/18/2020 9:07:29 AM This report has been signed electronically. Number of Addenda: 0

## 2020-10-19 ENCOUNTER — Encounter (HOSPITAL_COMMUNITY): Payer: Self-pay | Admitting: Gastroenterology

## 2020-10-19 NOTE — Anesthesia Postprocedure Evaluation (Signed)
Anesthesia Post Note  Patient: Angelica Turner  Procedure(s) Performed: ESOPHAGOGASTRODUODENOSCOPY (EGD) WITH PROPOFOL (N/A ) BIOPSY POLYPECTOMY     Patient location during evaluation: Endoscopy Anesthesia Type: MAC Level of consciousness: awake Pain management: pain level controlled Vital Signs Assessment: post-procedure vital signs reviewed and stable Respiratory status: spontaneous breathing, nonlabored ventilation, respiratory function stable and patient connected to nasal cannula oxygen Cardiovascular status: stable and blood pressure returned to baseline Postop Assessment: no apparent nausea or vomiting Anesthetic complications: no   No complications documented.  Last Vitals:  Vitals:   10/18/20 1021 10/18/20 1105  BP: 126/68 130/70  Pulse: 78 78  Resp: 19 18  Temp:  36.7 C  SpO2: 95% 98%    Last Pain:  Vitals:   10/18/20 1105  TempSrc:   PainSc: 0-No pain                 Atanacio Melnyk P Dickie Cloe

## 2020-10-20 LAB — SURGICAL PATHOLOGY

## 2022-12-11 IMAGING — CR DG NECK SOFT TISSUE
2 series · 2 of 2 positions shown · non-contrast
Comparison: None.

CLINICAL DATA: Foreign body sensation

EXAM:
NECK SOFT TISSUES - 1+ VIEW

[w soft tissue neck ap]
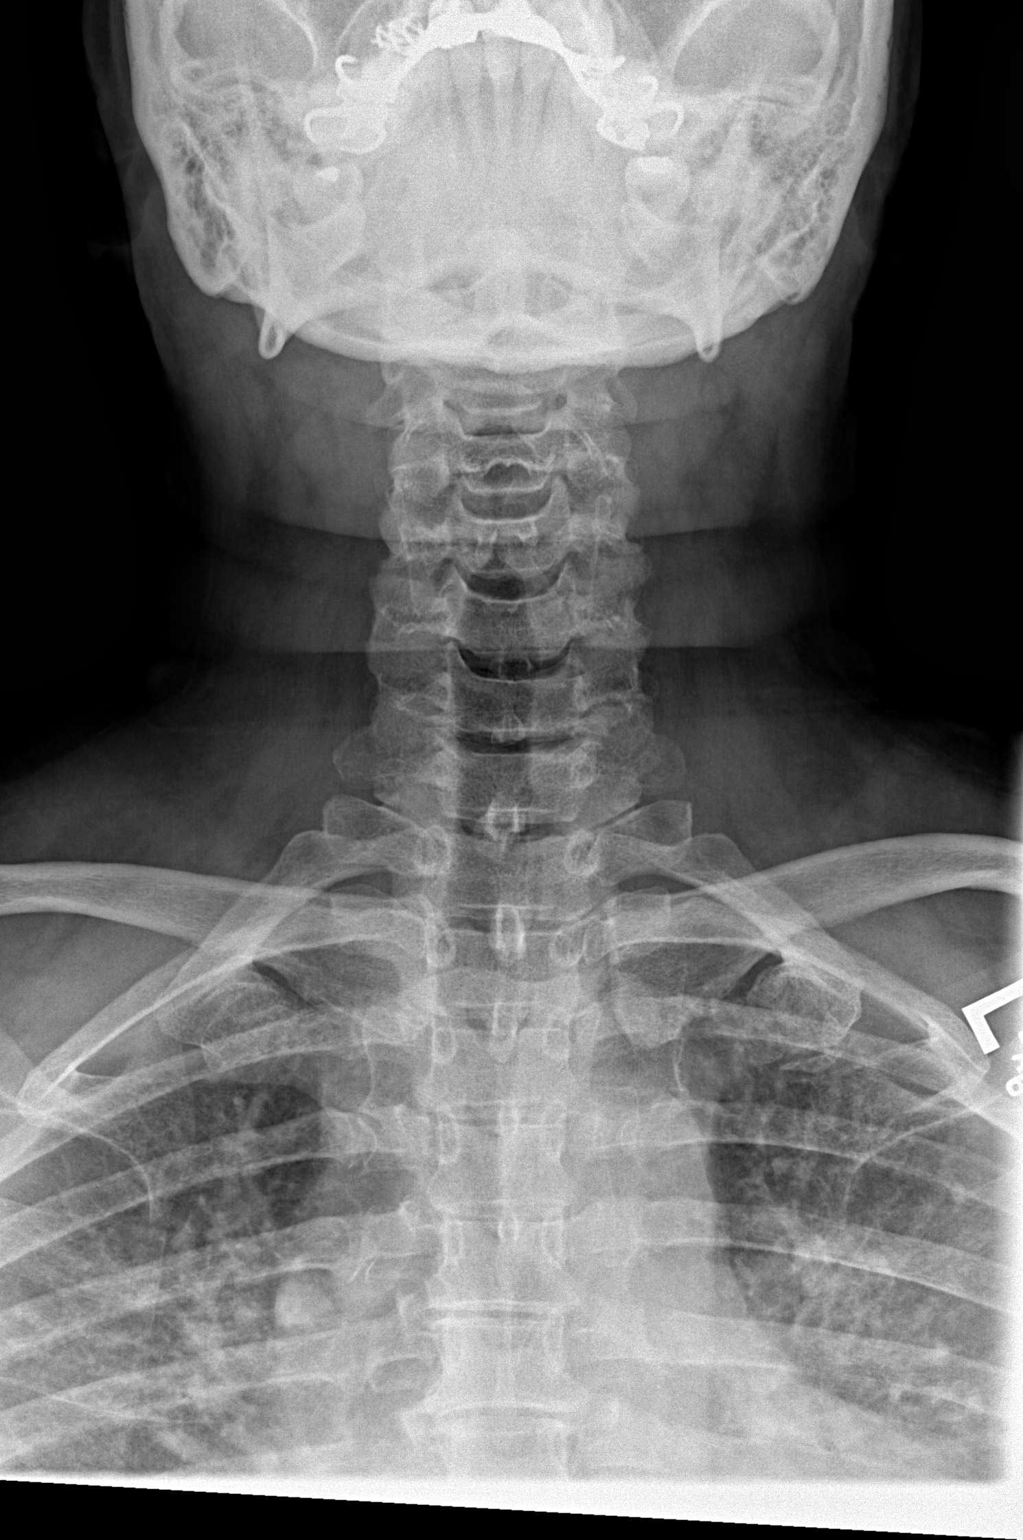

[w soft tissue neck lat]
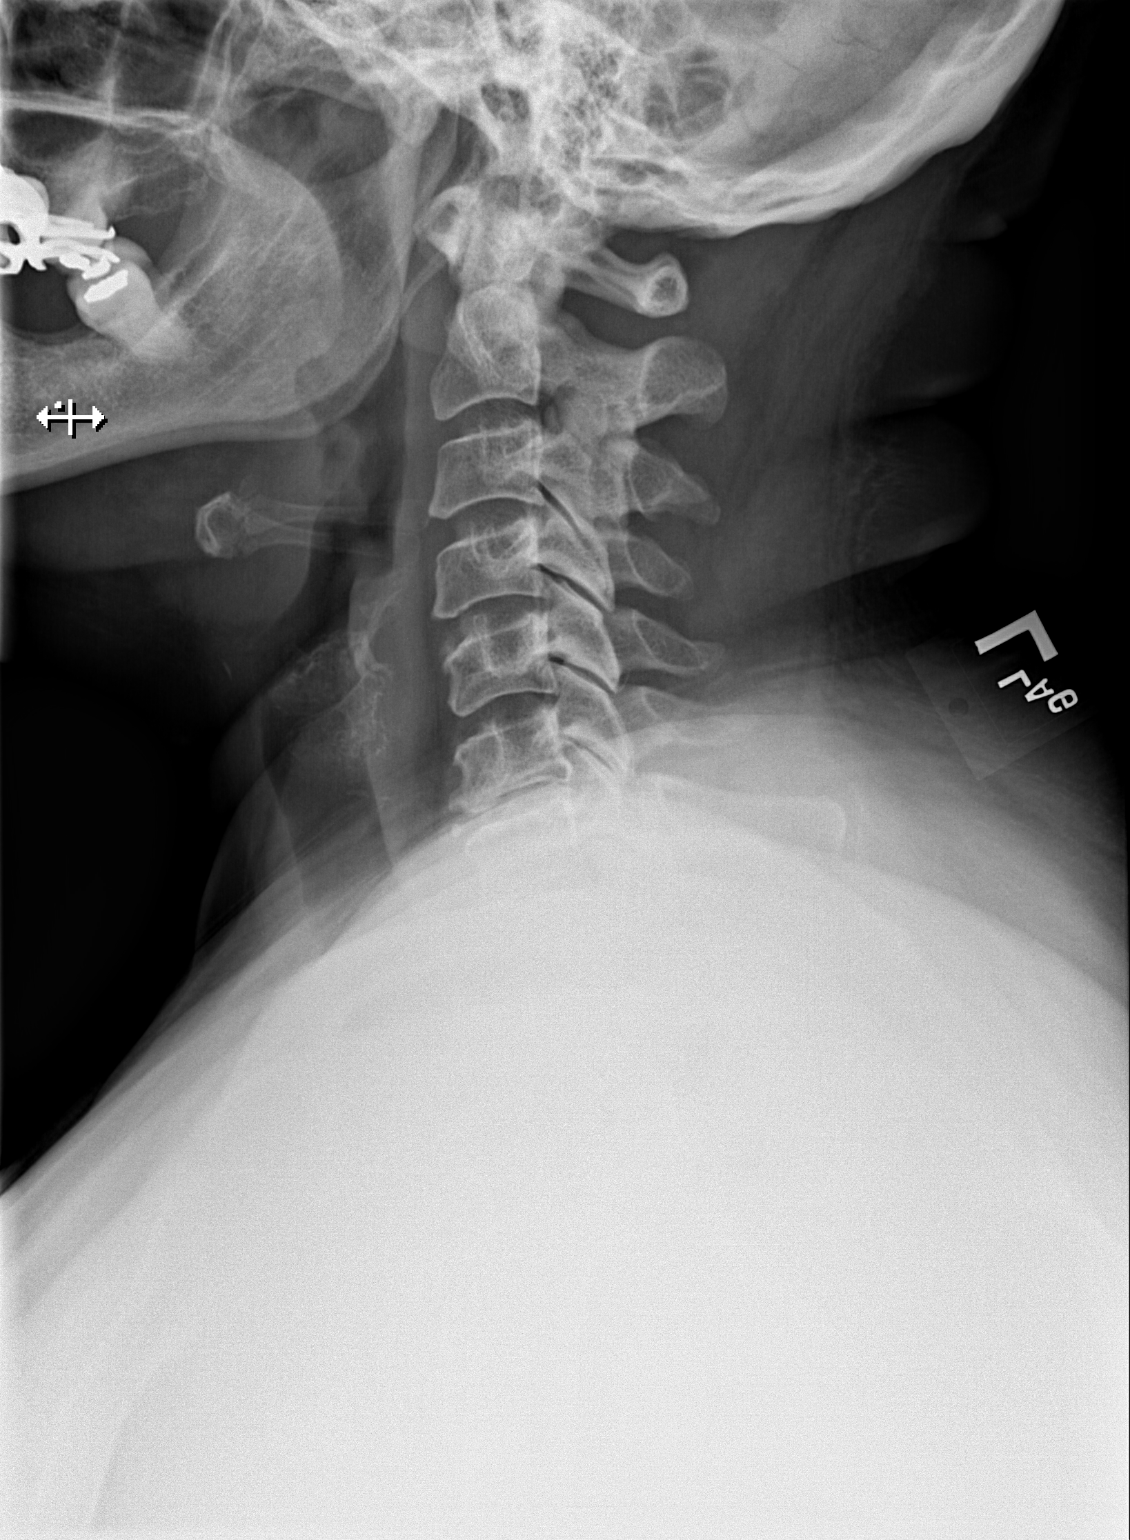

[2 of 2 positions shown; findings below may reference images not displayed]

FINDINGS: There is no evidence of retropharyngeal soft tissue swelling or
epiglottic enlargement. The cervical airway is unremarkable and no
radio-opaque foreign body identified.
IMPRESSION: Negative.

## 2023-09-18 ENCOUNTER — Ambulatory Visit: Payer: Self-pay | Admitting: Internal Medicine
